# Patient Record
Sex: Female | Born: 1950
Health system: Southern US, Community
[De-identification: ages and names within clinical notes are randomized; demographics above are authoritative.]

## PROBLEM LIST (undated history)

## (undated) HISTORY — PX: TUBAL LIGATION: SHX77

## (undated) HISTORY — PX: FOOT SURGERY: SHX648

---

## 1999-07-01 ENCOUNTER — Other Ambulatory Visit: Admission: RE | Admit: 1999-07-01 | Discharge: 1999-07-01 | Payer: Self-pay | Admitting: Obstetrics and Gynecology

## 1999-08-25 ENCOUNTER — Other Ambulatory Visit: Admission: RE | Admit: 1999-08-25 | Discharge: 1999-08-25 | Payer: Self-pay | Admitting: Obstetrics and Gynecology

## 2013-09-25 ENCOUNTER — Encounter: Payer: Self-pay | Admitting: Obstetrics

## 2013-09-25 ENCOUNTER — Ambulatory Visit (INDEPENDENT_AMBULATORY_CARE_PROVIDER_SITE_OTHER): Admitting: Obstetrics

## 2013-09-25 VITALS — BP 143/88 | HR 70 | Temp 98.0°F | Wt 148.0 lb

## 2013-09-25 DIAGNOSIS — Z01419 Encounter for gynecological examination (general) (routine) without abnormal findings: Secondary | ICD-10-CM

## 2013-09-25 DIAGNOSIS — N760 Acute vaginitis: Secondary | ICD-10-CM

## 2013-09-25 DIAGNOSIS — Z1239 Encounter for other screening for malignant neoplasm of breast: Secondary | ICD-10-CM

## 2013-09-25 NOTE — Progress Notes (Signed)
Subjective:     Kathy James is a 62 y.o. female here for a routine exam.  Current complaints: annual exam.   Personal health questionnaire reviewed: yes.   Gynecologic History No LMP recorded. Patient is postmenopausal. Contraception: none Last Pap: 2013. Results were: normal Last mammogram: approximately 15-20 years ago . Results were: unsure  Obstetric History OB History  No data available     The following portions of the patient's history were reviewed and updated as appropriate: allergies, current medications, past family history, past medical history, past social history, past surgical history and problem list.  Review of Systems Pertinent items are noted in HPI.    Objective:    General appearance: alert and no distress Breasts: normal appearance, no masses or tenderness Abdomen: normal findings: soft, non-tender Pelvic: cervix normal in appearance, external genitalia normal, no adnexal masses or tenderness, no cervical motion tenderness, uterus normal size, shape, and consistency and vagina normal without discharge    Assessment:    Healthy female exam.    Plan:    Education reviewed: calcium supplements and self breast exams. Mammogram ordered. Follow up in: 1 year.

## 2013-09-25 NOTE — Addendum Note (Signed)
Addended by: George Hugh on: 09/25/2013 05:29 PM   Modules accepted: Orders

## 2013-09-26 LAB — PAP IG W/ RFLX HPV ASCU

## 2013-09-26 LAB — WET PREP BY MOLECULAR PROBE: Gardnerella vaginalis: POSITIVE — AB

## 2013-09-26 LAB — GC/CHLAMYDIA PROBE AMP: GC Probe RNA: NEGATIVE

## 2013-10-10 ENCOUNTER — Other Ambulatory Visit: Payer: Self-pay | Admitting: *Deleted

## 2013-10-10 DIAGNOSIS — B9689 Other specified bacterial agents as the cause of diseases classified elsewhere: Secondary | ICD-10-CM

## 2013-10-10 MED ORDER — METRONIDAZOLE 500 MG PO TABS: 500.0000 mg | ORAL_TABLET | Freq: Two times a day (BID) | ORAL | Status: DC

## 2013-10-11 ENCOUNTER — Other Ambulatory Visit: Payer: Self-pay | Admitting: *Deleted

## 2013-10-11 ENCOUNTER — Ambulatory Visit (HOSPITAL_COMMUNITY)
Admission: RE | Admit: 2013-10-11 | Discharge: 2013-10-11 | Disposition: A | Discharge status: Home / Self Care | Source: Ambulatory Visit | Attending: Obstetrics | Admitting: Obstetrics

## 2013-10-11 DIAGNOSIS — B9689 Other specified bacterial agents as the cause of diseases classified elsewhere: Secondary | ICD-10-CM

## 2013-10-11 DIAGNOSIS — Z1239 Encounter for other screening for malignant neoplasm of breast: Secondary | ICD-10-CM

## 2013-10-11 DIAGNOSIS — Z1231 Encounter for screening mammogram for malignant neoplasm of breast: Secondary | ICD-10-CM | POA: Insufficient documentation

## 2013-10-11 MED ORDER — METRONIDAZOLE 500 MG PO TABS: 500.0000 mg | ORAL_TABLET | Freq: Two times a day (BID) | ORAL | Status: AC

## 2018-08-22 ENCOUNTER — Ambulatory Visit: Admitting: Physician Assistant

## 2018-12-31 DIAGNOSIS — H52223 Regular astigmatism, bilateral: Secondary | ICD-10-CM | POA: Diagnosis not present

## 2019-02-07 ENCOUNTER — Ambulatory Visit: Payer: Self-pay | Admitting: Family Medicine

## 2019-02-14 ENCOUNTER — Ambulatory Visit (INDEPENDENT_AMBULATORY_CARE_PROVIDER_SITE_OTHER): Payer: Medicare HMO | Admitting: Family Medicine

## 2019-02-14 ENCOUNTER — Encounter: Payer: Self-pay | Admitting: Family Medicine

## 2019-02-14 ENCOUNTER — Other Ambulatory Visit: Payer: Self-pay

## 2019-02-14 VITALS — BP 139/86 | HR 73 | Temp 98.4°F | Resp 18 | Ht 61.81 in | Wt 166.0 lb

## 2019-02-14 DIAGNOSIS — Z1329 Encounter for screening for other suspected endocrine disorder: Secondary | ICD-10-CM | POA: Diagnosis not present

## 2019-02-14 DIAGNOSIS — Z1321 Encounter for screening for nutritional disorder: Secondary | ICD-10-CM

## 2019-02-14 DIAGNOSIS — Z130 Encounter for screening for diseases of the blood and blood-forming organs and certain disorders involving the immune mechanism: Secondary | ICD-10-CM

## 2019-02-14 DIAGNOSIS — Z13220 Encounter for screening for lipoid disorders: Secondary | ICD-10-CM

## 2019-02-14 DIAGNOSIS — Z0001 Encounter for general adult medical examination with abnormal findings: Secondary | ICD-10-CM | POA: Diagnosis not present

## 2019-02-14 DIAGNOSIS — Z1159 Encounter for screening for other viral diseases: Secondary | ICD-10-CM | POA: Diagnosis not present

## 2019-02-14 DIAGNOSIS — Z13228 Encounter for screening for other metabolic disorders: Secondary | ICD-10-CM | POA: Diagnosis not present

## 2019-02-14 DIAGNOSIS — Z780 Asymptomatic menopausal state: Secondary | ICD-10-CM

## 2019-02-14 DIAGNOSIS — Z1231 Encounter for screening mammogram for malignant neoplasm of breast: Secondary | ICD-10-CM

## 2019-02-14 DIAGNOSIS — Z0000 Encounter for general adult medical examination without abnormal findings: Secondary | ICD-10-CM

## 2019-02-14 DIAGNOSIS — Z1211 Encounter for screening for malignant neoplasm of colon: Secondary | ICD-10-CM

## 2019-02-14 DIAGNOSIS — Z23 Encounter for immunization: Secondary | ICD-10-CM

## 2019-02-14 NOTE — Progress Notes (Signed)
3/12/20205:00 PM  Kathy James [birth year], 68 y.o. female [id number]  Chief Complaint  Patient presents with  . Establish Care/CPE    HPI:   Patient is a 68 y.o. female  who presents today for CPE  Last CPE 2014 Cervical Cancer Screening: 2013, normal Breast Cancer Screening: 2014, normal Colorectal Cancer Screening: ordered today Bone Density Testing: ordered today Seasonal Influenza Vaccination: today Td/Tdap Vaccination: 2013 Pneumococcal Vaccination: today Zoster Vaccination: at pharmacy of choice Frequency of Dental evaluation: dentures Frequency of Eye evaluation: wears eyeglasses, lenscrafter  Fall Risk  02/14/2019  Falls in the past year? 0  Number falls in past yr: 0  Injury with Fall? 0  Follow up Falls evaluation completed     Depression screen [facility] 2/9 02/14/2019  Decreased Interest 0  Down, Depressed, Hopeless 0  PHQ - 2 Score 0    No Known Allergies  Prior to Admission medications   Not on File    History reviewed. No pertinent past medical history.  Past Surgical History:  Procedure Laterality Date  . FOOT SURGERY    . TUBAL LIGATION      Social History   Tobacco Use  . Smoking status: Former Smoker    Types: Cigarettes    Last attempt to quit: 1978    Years since quitting: 42.2  . Smokeless tobacco: Never Used  Substance Use Topics  . Alcohol use: No    History reviewed. No pertinent family history.   6CIT Screen 02/14/2019  What Year? 0 points  What month? 0 points  What time? 0 points  Count back from 20 0 points  Months in reverse 0 points  Repeat phrase 0 points  Total Score 0    Functional Status Survey: Is the patient deaf or have difficulty hearing?: No Does the patient have difficulty seeing, even when wearing glasses/contacts?: No Does the patient have difficulty concentrating, remembering, or making decisions?: No Does the patient have difficulty walking or climbing stairs?: No Does the patient have  difficulty dressing or bathing?: No Does the patient have difficulty doing errands alone such as visiting a doctor's office or shopping?: No  Advance directives discussed with patient She has not completed forms, forms given Patient wishes to be DNR  Review of Systems  Constitutional: Negative for chills and fever.  Respiratory: Negative for cough and shortness of breath.   Cardiovascular: Negative for chest pain, palpitations and leg swelling.  Gastrointestinal: Negative for abdominal pain, nausea and vomiting.  All other systems reviewed and are negative.    OBJECTIVE:  Blood pressure 139/86, pulse 73, temperature 98.4 F (36.9 C), temperature source Oral, resp. rate 18, height 5' 1.81" (1.57 m), weight 166 lb (75.3 kg), SpO2 95 %. Body mass index is 30.55 kg/m.    Physical Exam Vitals signs and nursing note reviewed.  Constitutional:      Appearance: She is well-developed.  HENT:     Head: Normocephalic and atraumatic.     Right Ear: Hearing, tympanic membrane, ear canal and external ear normal.     Left Ear: Hearing, tympanic membrane, ear canal and external ear normal.     Mouth/Throat:     Mouth: Mucous membranes are moist.     Pharynx: No oropharyngeal exudate or posterior oropharyngeal erythema.  Eyes:     Extraocular Movements: Extraocular movements intact.     Conjunctiva/sclera: Conjunctivae normal.     Pupils: Pupils are equal, round, and reactive to light.  Neck:     Musculoskeletal:  Neck supple.     Thyroid: No thyromegaly.  Cardiovascular:     Rate and Rhythm: Normal rate and regular rhythm.     Heart sounds: Normal heart sounds. No murmur. No friction rub. No gallop.   Pulmonary:     Effort: Pulmonary effort is normal.     Breath sounds: Normal breath sounds. No wheezing, rhonchi or rales.  Abdominal:     General: Bowel sounds are normal. There is no distension.     Palpations: Abdomen is soft. There is no hepatomegaly, splenomegaly or mass.      Tenderness: There is no abdominal tenderness.  Musculoskeletal: Normal range of motion.     Right lower leg: No edema.     Left lower leg: No edema.  Lymphadenopathy:     Cervical: No cervical adenopathy.  Skin:    General: Skin is warm and dry.  Neurological:     Mental Status: She is alert and oriented to person, place, and time.     Cranial Nerves: No cranial nerve deficit.     Gait: Gait normal.     Deep Tendon Reflexes: Reflexes are normal and symmetric.  Psychiatric:        Mood and Affect: Mood normal.        Behavior: Behavior normal.     ASSESSMENT and PLAN  1. Annual physical exam No concerns per history or exam. Routine HCM labs ordered. HCM reviewed/discussed. Anticipatory guidance regarding healthy weight, lifestyle and choices given.   2. Need for prophylactic vaccination and inoculation against influenza - Flu vaccine HIGH DOSE PF  3. Need for pneumococcal vaccination - Pneumococcal conjugate vaccine 13-valent  4. Visit for screening mammogram - MM Digital Diagnostic Bilat; Future  5. Special screening for malignant neoplasms, colon - Ambulatory referral to Gastroenterology  6. Need for hepatitis C screening test - Hepatitis C Antibody  7. Postmenopausal estrogen deficiency - DG Bone Density; Future  8. Screening for endocrine, nutritional, metabolic and immunity disorder - CMP14+EGFR - TSH  9. Screening for lipid disorders - Lipid panel  Return in about 1 year (around 02/14/2020).    Rutherford Guys, MD [facility] [place], [place] [address] Ph.  [phone] Fax [phone]

## 2019-02-14 NOTE — Patient Instructions (Addendum)
Preventive Care 67 Years and Older, Female Preventive care refers to lifestyle choices and visits with your health care provider that can promote health and wellness. What does preventive care include?  A yearly physical exam. This is also called an annual well check.  Dental exams once or twice a year.  Routine eye exams. Ask your health care provider how often you should have your eyes checked.  Personal lifestyle choices, including: ? Daily care of your teeth and gums. ? Regular physical activity. ? Eating a healthy diet. ? Avoiding tobacco and drug use. ? Limiting alcohol use. ? Practicing safe sex. ? Taking low-dose aspirin every day. ? Taking vitamin and mineral supplements as recommended by your health care provider. What happens during an annual well check? The services and screenings done by your health care provider during your annual well check will depend on your age, overall health, lifestyle risk factors, and family history of disease. Counseling Your health care provider may ask you questions about your:  Alcohol use.  Tobacco use.  Drug use.  Emotional well-being.  Home and relationship well-being.  Sexual activity.  Eating habits.  History of falls.  Memory and ability to understand (cognition).  Work and work Statistician.  Reproductive health.  Screening You may have the following tests or measurements:  Height, weight, and BMI.  Blood pressure.  Lipid and cholesterol levels. These may be checked every 5 years, or more frequently if you are over 30 years old.  Skin check.  Lung cancer screening. You may have this screening every year starting at age 27 if you have a 30-pack-year history of smoking and currently smoke or have quit within the past 15 years.  Colorectal cancer screening. All adults should have this screening starting at age 33 and continuing until age 46. You will have tests every 1-10 years, depending on your results and the  type of screening test. People at increased risk should start screening at an earlier age. Screening tests may include: ? Guaiac-based fecal occult blood testing. ? Fecal immunochemical test (FIT). ? Stool DNA test. ? Virtual colonoscopy. ? Sigmoidoscopy. During this test, a flexible tube with a tiny camera (sigmoidoscope) is used to examine your rectum and lower colon. The sigmoidoscope is inserted through your anus into your rectum and lower colon. ? Colonoscopy. During this test, a long, thin, flexible tube with a tiny camera (colonoscope) is used to examine your entire colon and rectum.  Hepatitis C blood test.  Hepatitis B blood test.  Sexually transmitted disease (STD) testing.  Diabetes screening. This is done by checking your blood sugar (glucose) after you have not eaten for a while (fasting). You may have this done every 1-3 years.  Bone density scan. This is done to screen for osteoporosis. You may have this done starting at age 37.  Mammogram. This may be done every 1-2 years. Talk to your health care provider about how often you should have regular mammograms. Talk with your health care provider about your test results, treatment options, and if necessary, the need for more tests. Vaccines Your health care provider may recommend certain vaccines, such as:  Influenza vaccine. This is recommended every year.  Tetanus, diphtheria, and acellular pertussis (Tdap, Td) vaccine. You may need a Td booster every 10 years.  Varicella vaccine. You may need this if you have not been vaccinated.  Zoster vaccine. You may need this after age 38.  Measles, mumps, and rubella (MMR) vaccine. You may need at least  one dose of MMR if you were born in 1957 or later. You may also need a second dose.  Pneumococcal 13-valent conjugate (PCV13) vaccine. One dose is recommended after age 43.  Pneumococcal polysaccharide (PPSV23) vaccine. One dose is recommended after age 4.  Meningococcal  vaccine. You may need this if you have certain conditions.  Hepatitis A vaccine. You may need this if you have certain conditions or if you travel or work in places where you may be exposed to hepatitis A.  Hepatitis B vaccine. You may need this if you have certain conditions or if you travel or work in places where you may be exposed to hepatitis B.  Haemophilus influenzae type b (Hib) vaccine. You may need this if you have certain conditions. Talk to your health care provider about which screenings and vaccines you need and how often you need them. This information is not intended to replace advice given to you by your health care provider. Make sure you discuss any questions you have with your health care provider. Document Released: 12/18/2015 Document Revised: 01/11/2018 Document Reviewed: 09/22/2015 Elsevier Interactive Patient Education  [employer].     If you have lab work done today you will be contacted with your lab results within the next 2 weeks.  If you have not heard from [place] then please contact us. The fastest way to get your results is to register for My Chart.   IF you received an x-ray today, you will receive an invoice from [facility]iology. Please contact [facility] at [phone] with questions or concerns regarding your invoice.   IF you received labwork today, you will receive an invoice from [facility]. Please contact [facility] at [phone] with questions or concerns regarding your invoice.   Our billing staff will not be able to assist you with questions regarding bills from these companies.  You will be contacted with the lab results as soon as they are available. The fastest way to get your results is to activate your My Chart account. Instructions are located on the last page of this paperwork. If you have not heard from [place] regarding the results in 2 weeks, please contact this office.    We recommend that you schedule a mammogram for  breast cancer screening. Typically, you do not need a referral to do this. Please contact a local imaging center to schedule your mammogram.  [facility] - [phone]  *ask for the Radiology Department [facility] - [phone] or [phone]  [facility] - [phone] [place] [phone] [facility] - [phone]  *ask for the [facility] - [phone]  *ask for the Radiology Department [facility] - [phone]  *ask for the [facility] - [phone]

## 2019-02-15 LAB — LIPID PANEL
Chol/HDL Ratio: 3.6 ratio (ref 0.0–4.4)
Cholesterol, Total: 246 mg/dL — ABNORMAL HIGH (ref 100–199)
HDL: 69 mg/dL (ref >39)
LDL Calculated: 145 mg/dL — ABNORMAL HIGH (ref 0–99)
Triglycerides: 162 mg/dL — ABNORMAL HIGH (ref 0–149)
VLDL Cholesterol Cal: 32 mg/dL (ref 5–40)

## 2019-02-15 LAB — CMP14+EGFR
ALT: 19 IU/L (ref 0–32)
AST: 17 IU/L (ref 0–40)
Albumin/Globulin Ratio: 1.6 (ref 1.2–2.2)
Albumin: 4.6 g/dL (ref 3.8–4.8)
Alkaline Phosphatase: 95 IU/L (ref 39–117)
BUN/Creatinine Ratio: 28 (ref 12–28)
BUN: 18 mg/dL (ref 8–27)
Bilirubin Total: <0.2 mg/dL (ref 0.0–1.2)
CO2: 22 mmol/L (ref 20–29)
Calcium: 9.4 mg/dL (ref 8.7–10.3)
Chloride: 104 mmol/L (ref 96–106)
Creatinine, Ser: 0.64 mg/dL (ref 0.57–1.00)
GFR calc Af Amer: 108 mL/min/{1.73_m2} (ref >59)
GFR calc non Af Amer: 93 mL/min/{1.73_m2} (ref >59)
Globulin, Total: 2.8 g/dL (ref 1.5–4.5)
Glucose: 111 mg/dL — ABNORMAL HIGH (ref 65–99)
Potassium: 4.5 mmol/L (ref 3.5–5.2)
Sodium: 143 mmol/L (ref 134–144)
Total Protein: 7.4 g/dL (ref 6.0–8.5)

## 2019-02-15 LAB — HEPATITIS C ANTIBODY: Hep C Virus Ab: 0.1 s/co ratio (ref 0.0–0.9)

## 2019-02-15 LAB — TSH: TSH: 1.64 u[IU]/mL (ref 0.450–4.500)

## 2019-02-27 ENCOUNTER — Encounter: Payer: Self-pay | Admitting: Radiology

## 2019-05-24 ENCOUNTER — Ambulatory Visit
Admission: RE | Admit: 2019-05-24 | Discharge: 2019-05-24 | Disposition: A | Discharge status: Home / Self Care | Source: Ambulatory Visit | Attending: Family Medicine | Admitting: Family Medicine

## 2019-05-24 ENCOUNTER — Other Ambulatory Visit: Payer: Self-pay

## 2019-05-24 DIAGNOSIS — M85851 Other specified disorders of bone density and structure, right thigh: Secondary | ICD-10-CM | POA: Diagnosis not present

## 2019-05-24 DIAGNOSIS — Z780 Asymptomatic menopausal state: Secondary | ICD-10-CM

## 2019-05-24 DIAGNOSIS — Z1231 Encounter for screening mammogram for malignant neoplasm of breast: Secondary | ICD-10-CM | POA: Diagnosis not present

## 2019-06-28 ENCOUNTER — Encounter: Payer: Self-pay | Admitting: Gastroenterology

## 2019-06-28 ENCOUNTER — Ambulatory Visit (INDEPENDENT_AMBULATORY_CARE_PROVIDER_SITE_OTHER): Payer: Medicare HMO | Admitting: Gastroenterology

## 2019-06-28 VITALS — Ht 61.0 in | Wt 170.0 lb

## 2019-06-28 DIAGNOSIS — Z1211 Encounter for screening for malignant neoplasm of colon: Secondary | ICD-10-CM

## 2019-06-28 MED ORDER — NA SULFATE-K SULFATE-MG SULF 17.5-3.13-1.6 GM/177ML PO SOLN: 1.0000 | ORAL | 0 refills | Status: DC

## 2019-06-28 NOTE — Progress Notes (Signed)
TELEHEALTH VISIT  Referring Provider: Rutherford Guys, MD Primary Care Physician:  Rutherford Guys, MD   Tele-visit due to COVID-19 pandemic Patient requested visit virtually, consented to the virtual encounter via video enabled telemedicine application (Doximity) Contact made at: 13:00 06/28/19 Patient verified by name and date of birth Location of patient: Home Location provider: [facility] Names of persons participating: Me, patient, Tinnie Gens CMA Time spent on telehealth visit: 16 minutes I discussed the limitations of evaluation and management by telemedicine. The patient expressed understanding and agreed to proceed.  Reason for Consultation:  Colonoscopy   IMPRESSION:  Need for colon cancer screening No ongoing GI symptoms No known family history of colon cancer or polyps  PLAN: Colonoscopy  I consented the patient discussing the risks, benefits, and alternatives to endoscopic evaluation. In particular, we discussed the risks that include, but are not limited to, reaction to medication, cardiopulmonary compromise, bleeding requiring blood transfusion, aspiration resulting in pneumonia, perforation requiring surgery, lack of diagnosis, severe illness requiring hospitalization, and even death. We reviewed the risk of missed lesion including polyps or even cancer. The patient acknowledges these risks and asks that we proceed.  HPI: Kathy James is a 68 y.o. female referred by Dr. Pamella Pert for colon cancer screening.  The history is obtained through the patient and review of her electronic health record.  She is originally from [place]. Has lived in the [place] for almost 27 years.  Stays at home with her 5 grandchildren.   Colonoscopy over 10 years ago. She originally thought it was 6-8 years ago, but, she remembered that her son who drove her took her when he first got his drivers license over 10 years ago.  She remembers the exam being normal at that time.    There is no dysphagia, odynophagia, regurgitation,  heartburn, nausea, abdominal pain, change in bowel habits, melena, hematochezia, or bright red blood per rectum. There is no anorexia or recent change in weight.   No known family history of colon cancer or polyps. No family history of uterine/endometrial cancer, pancreatic cancer or gastric/stomach cancer.  History reviewed. No pertinent past medical history.  Past Surgical History:  Procedure Laterality Date  . FOOT SURGERY    . TUBAL LIGATION      Current Outpatient Medications  Medication Sig Dispense Refill  . Multiple Vitamin (MULTIVITAMIN) tablet Take 1 tablet by mouth daily.     No current facility-administered medications for this visit.     Allergies as of 06/28/2019  . (No Known Allergies)    History reviewed. No pertinent family history.  Social History   Socioeconomic History  . Marital status: Married    Spouse name: Not on file  . Number of children: 2  . Years of education: Not on file  . Highest education level: Not on file  Occupational History  . Not on file  Social Needs  . Financial resource strain: Not on file  . Food insecurity    Worry: Not on file    Inability: Not on file  . Transportation needs    Medical: Not on file    Non-medical: Not on file  Tobacco Use  . Smoking status: Former Smoker    Types: Cigarettes    Quit date: 1978    Years since quitting: 42.5  . Smokeless tobacco: Never Used  Substance and Sexual Activity  . Alcohol use: No  . Drug use: No  . Sexual activity: Yes  Lifestyle  .  Physical activity    Days per week: Not on file    Minutes per session: Not on file  . Stress: Not on file  Relationships  . Social Herbalist on phone: Not on file    Gets together: Not on file    Attends religious service: Not on file    Active member of club or organization: Not on file    Attends meetings of clubs or organizations: Not on file    Relationship status:  Not on file  . Intimate partner violence    Fear of current or ex partner: Not on file    Emotionally abused: Not on file    Physically abused: Not on file    Forced sexual activity: Not on file  Other Topics Concern  . Not on file  Social History Narrative  . Not on file    Review of Systems: ALL ROS discussed and all others negative except listed in HPI.  Physical Exam: Complete physical exam not performed due to the limits inherent in a telehealth encounter.  General: Awake, alert, and oriented, and well communicative. In no acute distress.  HEENT: EOMI, non-icteric sclera, NCAT, MMM  Neck: Normal movement of head and neck  Pulm: No labored breathing, speaking in full sentences without conversational dyspnea  Derm: No apparent lesions or bruising in visible field  MS: Moves all visible extremities without noticeable abnormality  Psych: Pleasant, cooperative, normal speech, normal affect and normal insight Neuro: Alert and appropriate   Zehra Rucci L. Tarri Glenn, MD, MPH [facility] 06/28/2019, 1:14 PM

## 2019-06-28 NOTE — Patient Instructions (Signed)
Tips for colonoscopy:  - Stay well hydrated for 3-4 days prior to the exam. This reduces nausea and dehydration.  - To prevent skin/hemorrhoid irritation - prior to wiping, put A&Dointment or vaseline on the toilet paper. - Keep a towel or pad on the bed.  - Drink  64oz of clear liquids in the morning of prep day (prior to starting the prep) to be sure that there is enough fluid to flush the colon and stay hydrated!!!! This is in addition to the fluids required for preparation. - Use of a flavored hard candy, such as grape Anise Salvo, can counteract some of the flavor of the prep and may prevent some nausea.

## 2019-07-16 ENCOUNTER — Telehealth: Payer: Self-pay | Admitting: Gastroenterology

## 2019-07-16 NOTE — Telephone Encounter (Signed)
Left message on vmail regarding Covid-19 screening questions. °Covid-19 Screening Questions: °  °Do you now or have you had a fever in the last 14 days?  °  °Do you have any respiratory symptoms of shortness of breath or cough now or in the last 14 days?  °  °Do you have any family members or close contacts with diagnosed  °or suspected Covid-19 in the past 14 days?  °  °Have you been tested for Covid-19 and found to be positive?  °  ° °

## 2019-07-17 ENCOUNTER — Encounter: Payer: Self-pay | Admitting: Gastroenterology

## 2019-07-17 ENCOUNTER — Ambulatory Visit (AMBULATORY_SURGERY_CENTER): Payer: Medicare HMO | Admitting: Gastroenterology

## 2019-07-17 ENCOUNTER — Other Ambulatory Visit: Payer: Self-pay

## 2019-07-17 VITALS — BP 113/63 | HR 68 | Temp 98.6°F | Resp 18 | Ht 61.0 in | Wt 170.0 lb

## 2019-07-17 DIAGNOSIS — Z1211 Encounter for screening for malignant neoplasm of colon: Secondary | ICD-10-CM

## 2019-07-17 DIAGNOSIS — D122 Benign neoplasm of ascending colon: Secondary | ICD-10-CM | POA: Diagnosis not present

## 2019-07-17 DIAGNOSIS — D120 Benign neoplasm of cecum: Secondary | ICD-10-CM | POA: Diagnosis not present

## 2019-07-17 MED ORDER — SODIUM CHLORIDE 0.9 % IV SOLN: 500.0000 mL | Freq: Once | INTRAVENOUS | Status: DC

## 2019-07-17 NOTE — Patient Instructions (Signed)
Please read handouts provided. Await pathology results. Continue present medications. High Fiber diet.        YOU HAD AN ENDOSCOPIC PROCEDURE TODAY AT [facility]:   Refer to the procedure report that was given to you for any specific questions about what was found during the examination.  If the procedure report does not answer your questions, please call your gastroenterologist to clarify.  If you requested that your care partner not be given the details of your procedure findings, then the procedure report has been included in a sealed envelope for you to review at your convenience later.  YOU SHOULD EXPECT: Some feelings of bloating in the abdomen. Passage of more gas than usual.  Walking can help get rid of the air that was put into your GI tract during the procedure and reduce the bloating. If you had a lower endoscopy (such as a colonoscopy or flexible sigmoidoscopy) you may notice spotting of blood in your stool or on the toilet paper. If you underwent a bowel prep for your procedure, you may not have a normal bowel movement for a few days.  Please Note:  You might notice some irritation and congestion in your nose or some drainage.  This is from the oxygen used during your procedure.  There is no need for concern and it should clear up in a day or so.  SYMPTOMS TO REPORT IMMEDIATELY:   Following lower endoscopy (colonoscopy or flexible sigmoidoscopy):  Excessive amounts of blood in the stool  Significant tenderness or worsening of abdominal pains  Swelling of the abdomen that is new, acute  Fever of 100F or higher   For urgent or emergent issues, a gastroenterologist can be reached at any hour by calling [phone].   DIET:  We do recommend a small meal at first, but then you may proceed to your regular diet.  Drink plenty of fluids but you should avoid alcoholic beverages for 24 hours.  ACTIVITY:  You should plan to take it easy for the rest of today  and you should NOT DRIVE or use heavy machinery until tomorrow (because of the sedation medicines used during the test).    FOLLOW UP: Our staff will call the number listed on your records 48-72 hours following your procedure to check on you and address any questions or concerns that you may have regarding the information given to you following your procedure. If we do not reach you, we will leave a message.  We will attempt to reach you two times.  During this call, we will ask if you have developed any symptoms of COVID 19. If you develop any symptoms (ie: fever, flu-like symptoms, shortness of breath, cough etc.) before then, please call [phone].  If you test positive for Covid 19 in the 2 weeks post procedure, please call and report this information to [place].    If any biopsies were taken you will be contacted by phone or by letter within the next 1-3 weeks.  Please call us at [phone] if you have not heard about the biopsies in 3 weeks.    SIGNATURES/CONFIDENTIALITY: You and/or your care partner have signed paperwork which will be entered into your electronic medical record.  These signatures attest to the fact that that the information above on your After Visit Summary has been reviewed and is understood.  Full responsibility of the confidentiality of this discharge information lies with you and/or your care-partner.

## 2019-07-17 NOTE — Progress Notes (Signed)
Vitals by CW 

## 2019-07-17 NOTE — Progress Notes (Signed)
Called to room to assist during endoscopic procedure.  Patient ID and intended procedure confirmed with present staff. Received instructions for my participation in the procedure from the performing physician.  

## 2019-07-17 NOTE — Progress Notes (Signed)
PT taken to PACU. Monitors in place. VSS. Report given to RN. 

## 2019-07-17 NOTE — Telephone Encounter (Signed)
Pt returned call and answered “No” to all questions.  °  °Pt made aware of that care partner may come to the lobby during the procedure but will need to provide their own mask. ° ° °

## 2019-07-17 NOTE — Op Note (Signed)
[facility] Patient Name: Kathy James Procedure Date: 07/17/2019 1:09 PM MRN: [id number] Endoscopist: Thornton Park MD, MD Age: 68 Referring MD:  Date of Birth: Jun 14, 1951 Gender: Female Account #: [id number] Procedure:                Colonoscopy Indications:              Screening for colorectal malignant neoplasm                           No ongoing GI symptoms                           No known family history of colon cancer or polyps                           Last colonoscopy 10 years ago Medicines:                See the Anesthesia note for documentation of the                            administered medications Procedure:                Pre-Anesthesia Assessment:                           - Prior to the procedure, a History and Physical                            was performed, and patient medications and                            allergies were reviewed. The patient's tolerance of                            previous anesthesia was also reviewed. The risks                            and benefits of the procedure and the sedation                            options and risks were discussed with the patient.                            All questions were answered, and informed consent                            was obtained. Prior Anticoagulants: The patient has                            taken no previous anticoagulant or antiplatelet                            agents. ASA Grade Assessment: II - A patient with  mild systemic disease. After reviewing the risks                            and benefits, the patient was deemed in                            satisfactory condition to undergo the procedure.                           After obtaining informed consent, the colonoscope                            was passed under direct vision. Throughout the                            procedure, the patient's blood pressure, pulse, and                 oxygen saturations were monitored continuously. The                            Colonoscope was introduced through the anus and                            advanced to the the terminal ileum, with                            identification of the appendiceal orifice and IC                            valve. A second forward view of the right colon was                            performed. The colonoscopy was performed without                            difficulty. The patient tolerated the procedure                            well. The quality of the bowel preparation was                            good. The terminal ileum, ileocecal valve,                            appendiceal orifice, and rectum were photographed. Scope In: 1:33:02 PM Scope Out: 1:50:53 PM Scope Withdrawal Time: 0 hours 15 minutes 19 seconds  Total Procedure Duration: 0 hours 17 minutes 51 seconds  Findings:                 Multiple small and large-mouthed diverticula were                            found in the entire colon.  A 2 mm polyp was found in the cecum. The polyp was                            sessile. The polyp was removed with a cold snare.                            Resection and retrieval were complete. Estimated                            blood loss was minimal.                           A less than 1 mm polyp was found in the cecum. The                            polyp was sessile. I was unable to remove it with a                            snare because it was so small. The polyp was                            removed with a cold biopsy forceps. Resection and                            retrieval were complete.                           A 2 mm polyp was found in the ascending colon. The                            polyp was sessile. The polyp was removed with a                            cold snare. Resection and retrieval were complete.                             Estimated blood loss was minimal.                           Two sessile polyps were found in the distal                            ascending colon. These polyps were removed with a                            cold biopsy forceps. Resection and retrieval were                            complete. Estimated blood loss was minimal.                           The exam was otherwise without abnormality on  direct and retroflexion views. Complications:            No immediate complications. Estimated blood loss:                            Minimal. Estimated Blood Loss:     Estimated blood loss was minimal. Impression:               - Diverticulosis in the entire examined colon.                           - One 2 mm polyp in the cecum, removed with a cold                            snare. Resected and retrieved.                           - One less than 1 mm polyp in the cecum, removed                            with a cold biopsy forceps. Resected and retrieved.                           - One 2 mm polyp in the ascending colon, removed                            with a cold snare. Resected and retrieved.                           - Two polyps in the distal ascending colon, removed                            with a cold biopsy forceps. Resected and retrieved.                           - The examination was otherwise normal on direct                            and retroflexion views. Recommendation:           - Patient has a contact number available for                            emergencies. The signs and symptoms of potential                            delayed complications were discussed with the                            patient. Return to normal activities tomorrow.                            Written discharge instructions were provided to the  patient.                           - High fiber diet.                           - Continue present  medications.                           - Await pathology results.                           - Repeat colonoscopy in 3 years for surveillance if                            at least 3 polyps are adenomas. If only 1 or 2                            polyps are adenomas, repeat colonoscopy in 7 years. Thornton Park MD, MD 07/17/2019 2:00:41 PM This report has been signed electronically.

## 2019-07-19 ENCOUNTER — Telehealth: Payer: Self-pay | Admitting: *Deleted

## 2019-07-19 NOTE — Telephone Encounter (Signed)
First follow up call attempt.  Reached voicemail with phone number identified.  Message left to call if any questions or concerns regarding procedure or COVID19 

## 2019-07-19 NOTE — Telephone Encounter (Signed)
  Follow up Call-  Call back number 07/17/2019  Post procedure Call Back phone  # [phone]  Permission to leave phone message Yes  Some recent data might be hidden     Patient questions:  Do you have a fever, pain , or abdominal swelling? No. Pain Score  0 *  Have you tolerated food without any problems? Yes.    Have you been able to return to your normal activities? Yes.    Do you have any questions about your discharge instructions: Diet   No. Medications  No. Follow up visit  No.  Do you have questions or concerns about your Care? No.  Actions: * If pain score is 4 or above: No action needed, pain <4.  1. Have you developed a fever since your procedure? no  2.   Have you had an respiratory symptoms (SOB or cough) since your procedure? no  3.   Have you tested positive for COVID 19 since your procedure no  4.   Have you had any family members/close contacts diagnosed with the COVID 19 since your procedure?  no   If yes to any of these questions please route to Joylene John, RN and Alphonsa Gin, Therapist, sports.

## 2019-07-24 ENCOUNTER — Encounter: Payer: Self-pay | Admitting: Gastroenterology

## 2020-02-17 ENCOUNTER — Ambulatory Visit (INDEPENDENT_AMBULATORY_CARE_PROVIDER_SITE_OTHER): Payer: Medicare HMO | Admitting: Family Medicine

## 2020-02-17 VITALS — BP 113/63 | Ht 61.0 in | Wt 168.0 lb

## 2020-02-17 DIAGNOSIS — Z0000 Encounter for general adult medical examination without abnormal findings: Secondary | ICD-10-CM

## 2020-02-17 NOTE — Progress Notes (Signed)
Presents today for [employer] Visit   Date of last exam:  02/14/2019  Interpreter used for this visit?  I connected with  Kathy James on 02/17/20 by a telephone and verified that I am speaking with the correct person using two identifiers.   I discussed the limitations of evaluation and management by telemedicine. The patient expressed understanding and agreed to proceed.   Patient Care Team: Rutherford Guys, MD as PCP - General (Family Medicine)   Other items to address today:  Discussed immunizations Discussed Eye/Dental    Other Screening:  Last lipid screening: 02/14/2019  ADVANCE DIRECTIVES: Discussed: yes On File: no Materials Provided: yes  Immunization status:  Immunization History  Administered Date(s) Administered  . Influenza, High Dose Seasonal PF 02/14/2019  . Pneumococcal Conjugate-13 02/14/2019  . Tdap 02/17/2012     Health Maintenance Due  Topic Date Due  . PNA vac Low Risk Adult (2 of 2 - PPSV23) 02/14/2020     Functional Status Survey: Is the patient deaf or have difficulty hearing?: No Does the patient have difficulty seeing, even when wearing glasses/contacts?: No Does the patient have difficulty concentrating, remembering, or making decisions?: Yes(normal forgets where keyes) Does the patient have difficulty walking or climbing stairs?: No Does the patient have difficulty dressing or bathing?: No Does the patient have difficulty doing errands alone such as visiting a doctor's office or shopping?: No   6CIT Screen 02/17/2020 02/14/2019  What Year? 0 points 0 points  What month? 0 points 0 points  What time? (No Data) 0 points  Count back from 20 0 points 0 points  Months in reverse 0 points 0 points  Repeat phrase 4 points 0 points  Total Score - 0        Clinical Support from 02/17/2020 in [facility]  AUDIT-C Score  0       Home Environment:   Lives in two home Takes care of 4 grand kids   Ages [id number] they live with patient No clutter No scattered rugs yes grab bars Adequate lighting/no clutter      Past Surgical History:  Procedure Laterality Date  . FOOT SURGERY    . TUBAL LIGATION       History reviewed. No pertinent family history.   Social History   Socioeconomic History  . Marital status: Married    Spouse name: Not on file  . Number of children: 2  . Years of education: Not on file  . Highest education level: Not on file  Occupational History  . Not on file  Tobacco Use  . Smoking status: Former Smoker    Types: Cigarettes    Quit date: 1978    Years since quitting: 43.2  . Smokeless tobacco: Never Used  Substance and Sexual Activity  . Alcohol use: No  . Drug use: No  . Sexual activity: Yes  Other Topics Concern  . Not on file  Social History Narrative  . Not on file   Social Determinants of Health   Financial Resource Strain:   . Difficulty of Paying Living Expenses:   Food Insecurity:   . Worried About Charity fundraiser in the Last Year:   . Arboriculturist in the Last Year:   Transportation Needs:   . Film/video editor (Medical):   Marland Kitchen Lack of Transportation (Non-Medical):   Physical Activity:   . Days of Exercise per Week:   . Minutes of  Exercise per Session:   Stress:   . Feeling of Stress :   Social Connections:   . Frequency of Communication with Friends and Family:   . Frequency of Social Gatherings with Friends and Family:   . Attends Religious Services:   . Active Member of Clubs or Organizations:   . Attends Archivist Meetings:   Marland Kitchen Marital Status:   Intimate Partner Violence:   . Fear of Current or Ex-Partner:   . Emotionally Abused:   Marland Kitchen Physically Abused:   . Sexually Abused:      No Known Allergies   Prior to Admission medications   Medication Sig Start Date End Date Taking? Authorizing Provider  Ibuprofen (ADVIL) 200 MG CAPS Take by mouth as needed.   Yes [provider]    Multiple Vitamin (MULTIVITAMIN) tablet Take 1 tablet by mouth daily.   Yes [provider]     Depression screen [facility] 2/9 02/17/2020 02/14/2019  Decreased Interest 0 0  Down, Depressed, Hopeless 0 0  PHQ - 2 Score 0 0     Fall Risk  02/17/2020 02/14/2019  Falls in the past year? 1 0  Number falls in past yr: 0 0  Comment dog tripped her   no injury -  Injury with Fall? 0 0  Follow up Falls evaluation completed;Education provided Falls evaluation completed      PHYSICAL EXAM: BP 113/63 Comment: not in clinic  Ht 5\' 1"  (1.549 m)   Wt 168 lb (76.2 kg) Comment: per patient  BMI 31.74 kg/m    Wt Readings from Last 3 Encounters:  02/17/20 168 lb (76.2 kg)  07/17/19 170 lb (77.1 kg)  06/28/19 170 lb (77.1 kg)     No exam data present    Physical Exam   Education/Counseling provided regarding diet and exercise, prevention of chronic diseases, smoking/tobacco cessation, if applicable, and reviewed "Covered Medicare Preventive Services."

## 2020-02-17 NOTE — Patient Instructions (Addendum)
Thank you for taking time to come for your Medicare Wellness Visit. I appreciate your ongoing commitment to your health goals. Please review the following plan we discussed and let me know if I can assist you in the future.  Leroy Kennedy LPN  reventive Care 64 Years and Older, Female Preventive care refers to lifestyle choices and visits with your health care provider that can promote health and wellness. This includes:  A yearly physical exam. This is also called an annual well check.  Regular dental and eye exams.  Immunizations.  Screening for certain conditions.  Healthy lifestyle choices, such as diet and exercise. What can I expect for my preventive care visit? Physical exam Your health care provider will check:  Height and weight. These may be used to calculate body mass index (BMI), which is a measurement that tells if you are at a healthy weight.  Heart rate and blood pressure.  Your skin for abnormal spots. Counseling Your health care provider may ask you questions about:  Alcohol, tobacco, and drug use.  Emotional well-being.  Home and relationship well-being.  Sexual activity.  Eating habits.  History of falls.  Memory and ability to understand (cognition).  Work and work Statistician.  Pregnancy and menstrual history. What immunizations do I need?  Influenza (flu) vaccine  This is recommended every year. Tetanus, diphtheria, and pertussis (Tdap) vaccine  You may need a Td booster every 10 years. Varicella (chickenpox) vaccine  You may need this vaccine if you have not already been vaccinated. Zoster (shingles) vaccine  You may need this after age 88. Pneumococcal conjugate (PCV13) vaccine  One dose is recommended after age 53. Pneumococcal polysaccharide (PPSV23) vaccine  One dose is recommended after age 75. Measles, mumps, and rubella (MMR) vaccine  You may need at least one dose of MMR if you were born in 1957 or later. You may also  need a second dose. Meningococcal conjugate (MenACWY) vaccine  You may need this if you have certain conditions. Hepatitis A vaccine  You may need this if you have certain conditions or if you travel or work in places where you may be exposed to hepatitis A. Hepatitis B vaccine  You may need this if you have certain conditions or if you travel or work in places where you may be exposed to hepatitis B. Haemophilus influenzae type b (Hib) vaccine  You may need this if you have certain conditions. You may receive vaccines as individual doses or as more than one vaccine together in one shot (combination vaccines). Talk with your health care provider about the risks and benefits of combination vaccines. What tests do I need? Blood tests  Lipid and cholesterol levels. These may be checked every 5 years, or more frequently depending on your overall health.  Hepatitis C test.  Hepatitis B test. Screening  Lung cancer screening. You may have this screening every year starting at age 56 if you have a 30-pack-year history of smoking and currently smoke or have quit within the past 15 years.  Colorectal cancer screening. All adults should have this screening starting at age 78 and continuing until age 39. Your health care provider may recommend screening at age 45 if you are at increased risk. You will have tests every 1-10 years, depending on your results and the type of screening test.  Diabetes screening. This is done by checking your blood sugar (glucose) after you have not eaten for a while (fasting). You may have this done every 1-3  years.  Mammogram. This may be done every 1-2 years. Talk with your health care provider about how often you should have regular mammograms.  BRCA-related cancer screening. This may be done if you have a family history of breast, ovarian, tubal, or peritoneal cancers. Other tests  Sexually transmitted disease (STD) testing.  Bone density scan. This is done  to screen for osteoporosis. You may have this done starting at age 94. Follow these instructions at home: Eating and drinking  Eat a diet that includes fresh fruits and vegetables, whole grains, lean protein, and low-fat dairy products. Limit your intake of foods with high amounts of sugar, saturated fats, and salt.  Take vitamin and mineral supplements as recommended by your health care provider.  Do not drink alcohol if your health care provider tells you not to drink.  If you drink alcohol: ? Limit how much you have to 0-1 drink a day. ? Be aware of how much alcohol is in your drink. In the [place] one drink equals one 12 oz bottle of beer (355 mL), one 5 oz glass of wine (148 mL), or one 1 oz glass of hard liquor (44 mL). Lifestyle  Take daily care of your teeth and gums.  Stay active. Exercise for at least 30 minutes on 5 or more days each week.  Do not use any products that contain nicotine or tobacco, such as cigarettes, e-cigarettes, and chewing tobacco. If you need help quitting, ask your health care provider.  If you are sexually active, practice safe sex. Use a condom or other form of protection in order to prevent STIs (sexually transmitted infections).  Talk with your health care provider about taking a low-dose aspirin or statin. What's next?  Go to your health care provider once a year for a well check visit.  Ask your health care provider how often you should have your eyes and teeth checked.  Stay up to date on all vaccines. This information is not intended to replace advice given to you by your health care provider. Make sure you discuss any questions you have with your health care provider. Document Revised: 11/15/2018 Document Reviewed: 11/15/2018 Elsevier Patient Education  2020 [employer].

## 2020-07-06 IMAGING — MG DIGITAL SCREENING BILATERAL MAMMOGRAM WITH CAD
4 series · 4 of 4 positions shown · non-contrast
Comparison: Previous exam(s).

CLINICAL DATA: Screening.

EXAM:
DIGITAL SCREENING BILATERAL MAMMOGRAM WITH CAD

[L MLO]
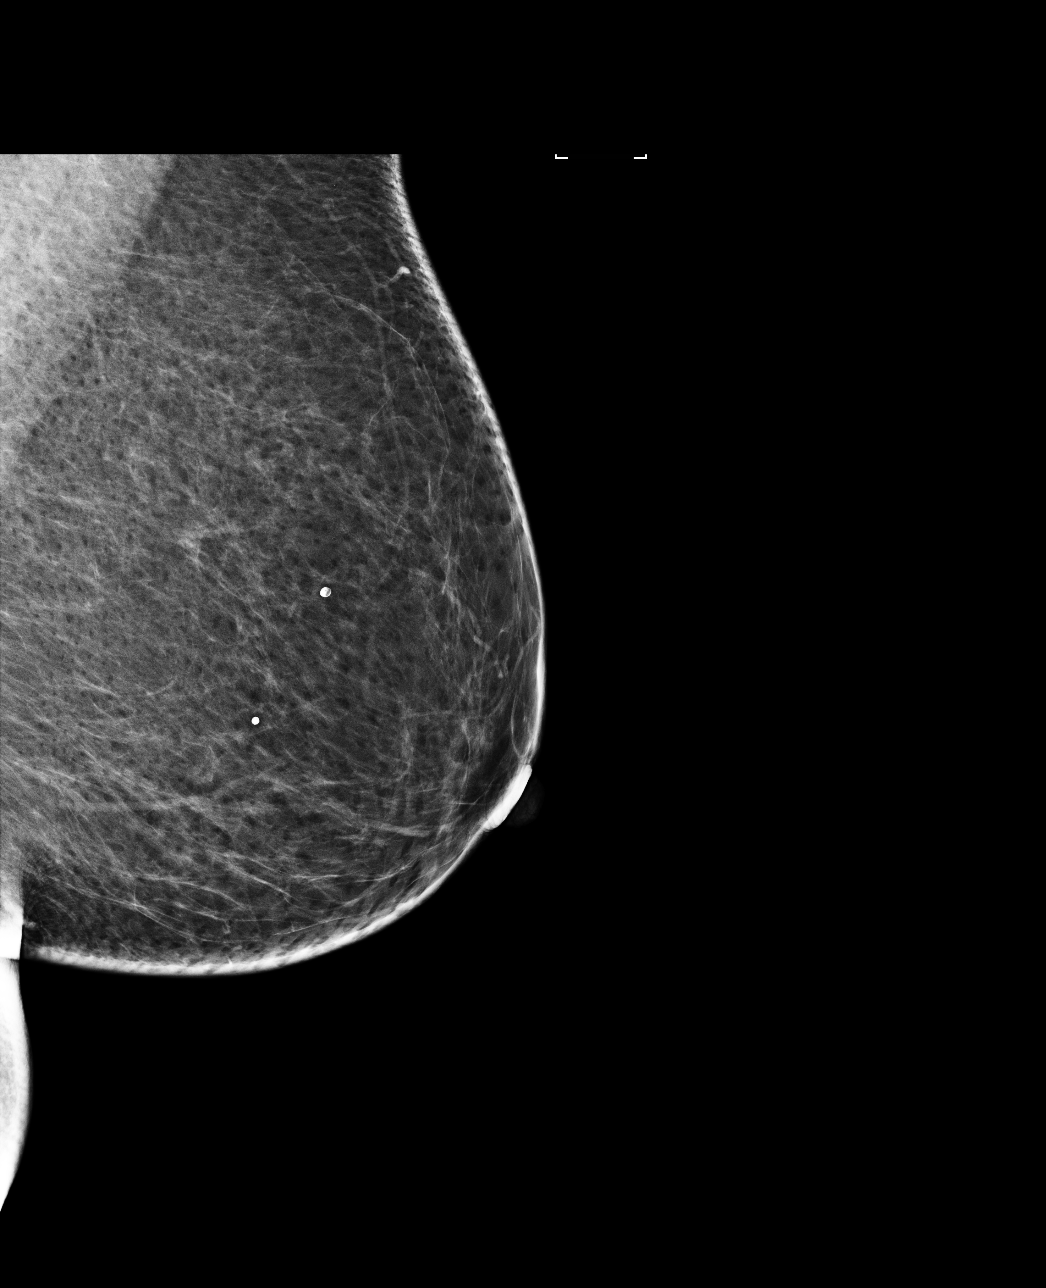

[R MLO]
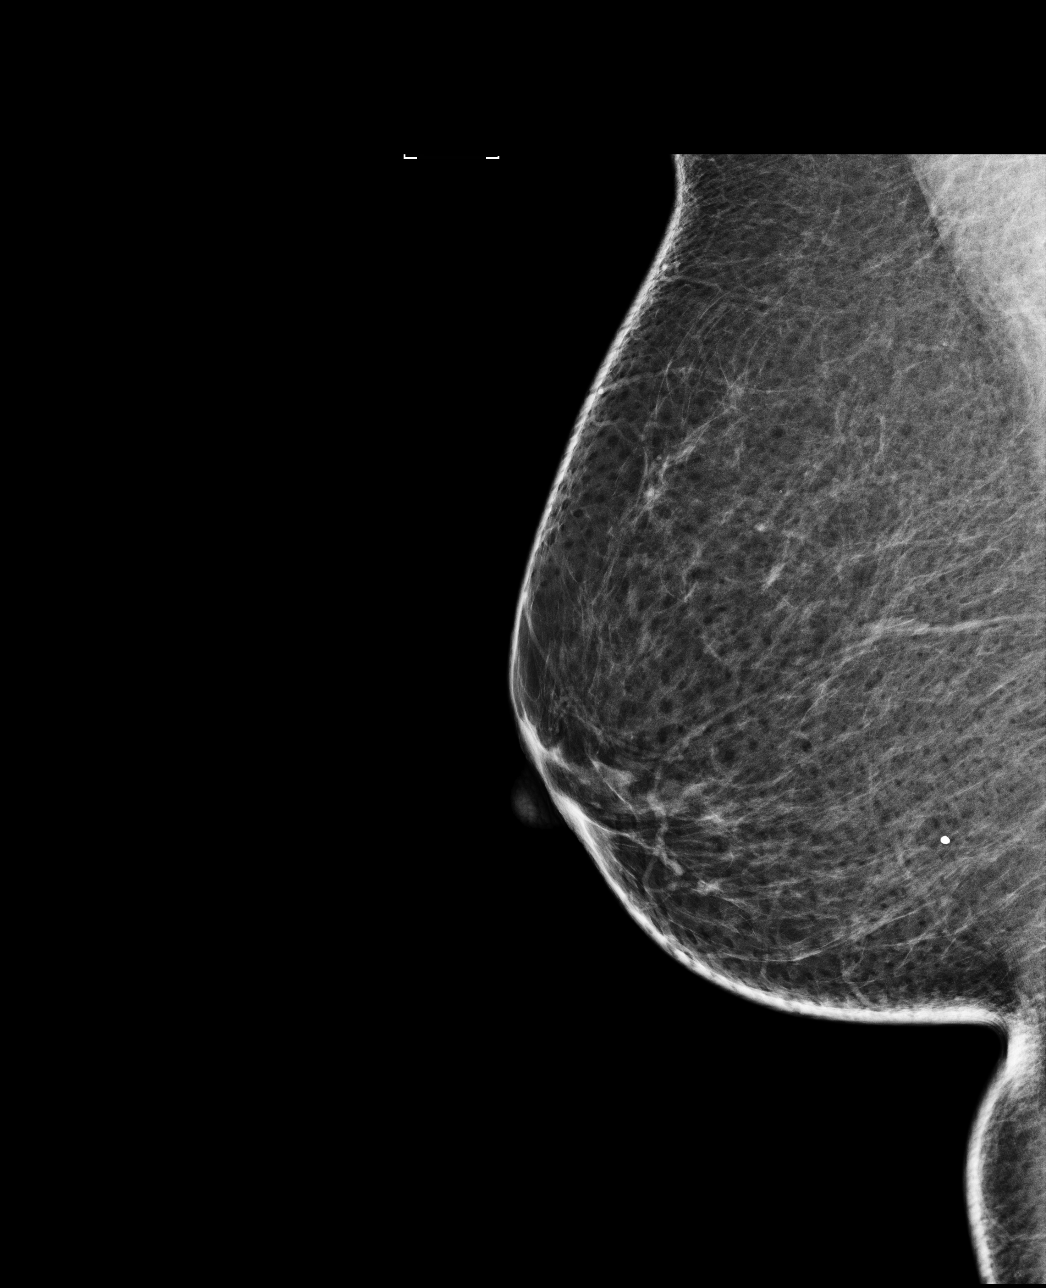

[L CC]
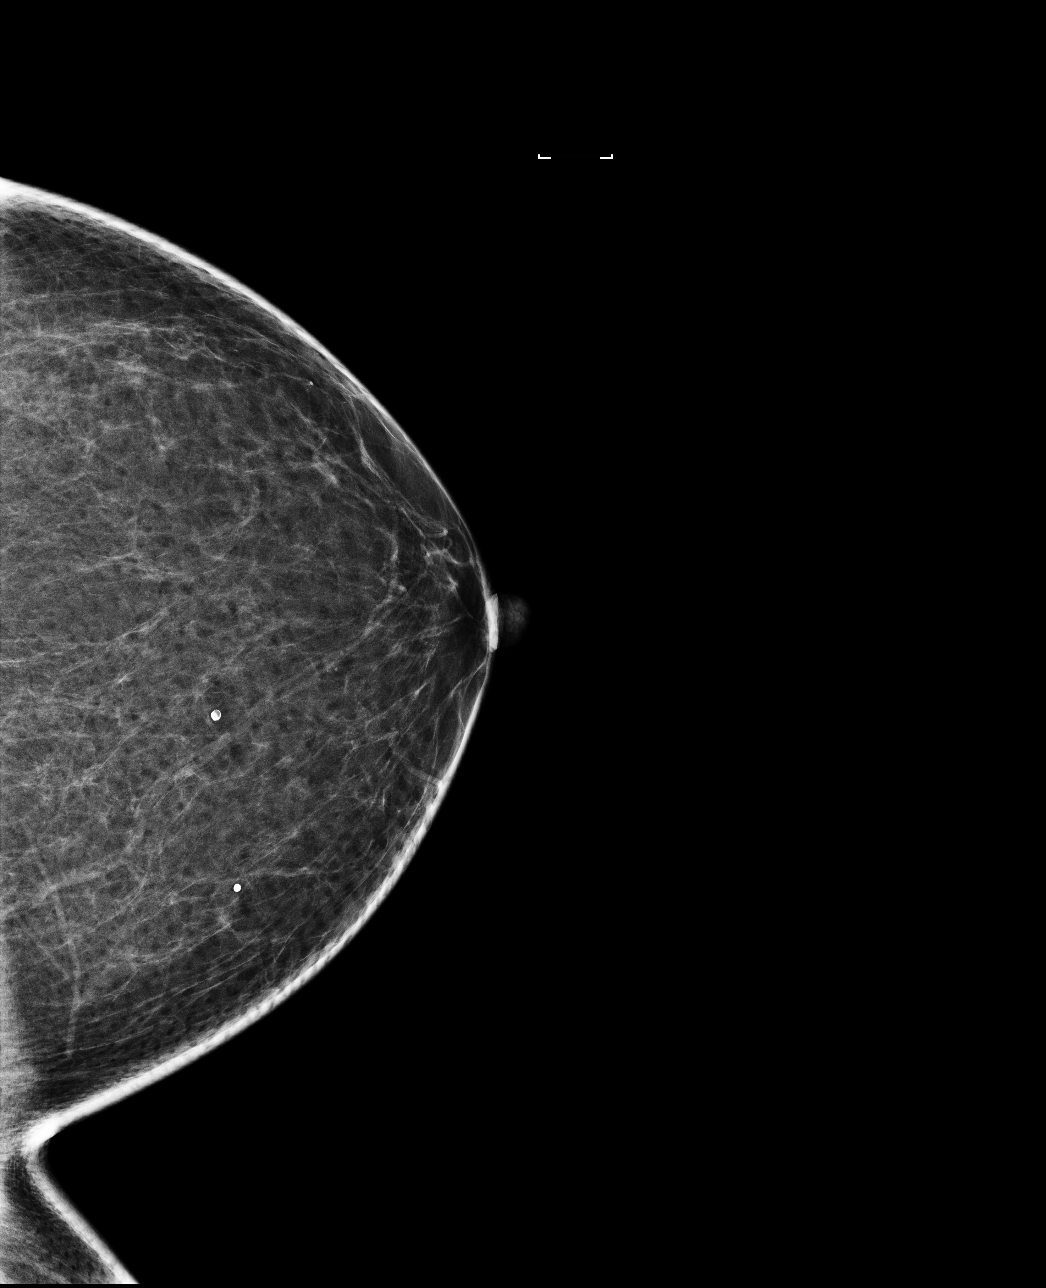

[R CC]
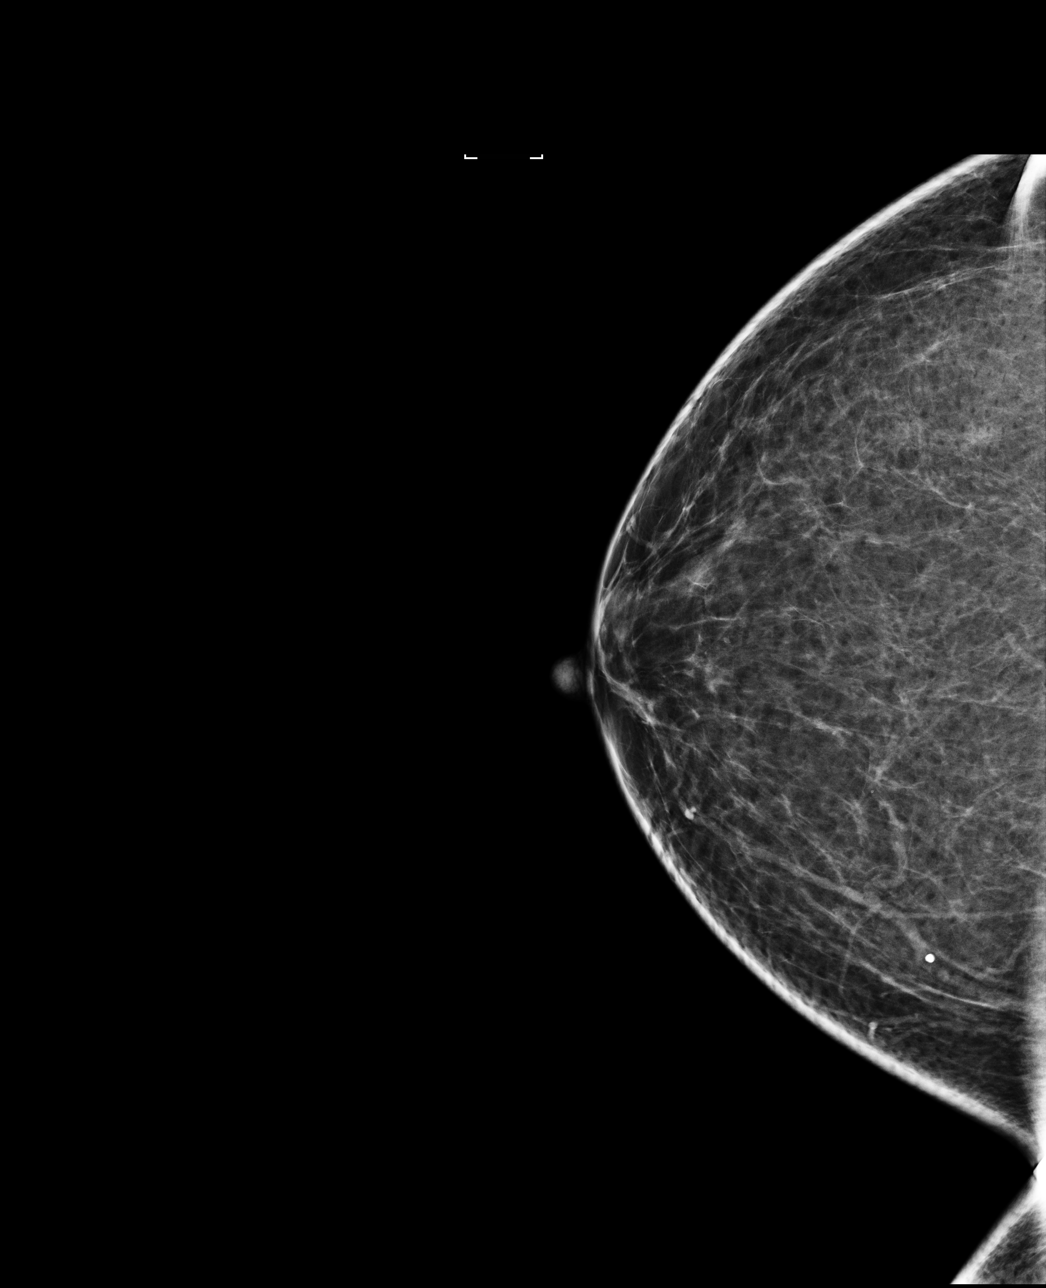

[4 of 4 positions shown; findings below may reference images not displayed]

ACR Breast Density Category b: There are scattered areas of
fibroglandular density.
FINDINGS: There are no findings suspicious for malignancy. Images were
processed with CAD.
IMPRESSION: No mammographic evidence of malignancy. A result letter of this
screening mammogram will be mailed directly to the patient.

RECOMMENDATION:
Screening mammogram in one year. (Code:AS-G-LCT)

BI-RADS CATEGORY  1: Negative.

## 2020-11-04 DIAGNOSIS — H52223 Regular astigmatism, bilateral: Secondary | ICD-10-CM | POA: Diagnosis not present

## 2021-11-05 DIAGNOSIS — H5203 Hypermetropia, bilateral: Secondary | ICD-10-CM | POA: Diagnosis not present

## 2022-07-04 DIAGNOSIS — Z79899 Other long term (current) drug therapy: Secondary | ICD-10-CM | POA: Diagnosis not present

## 2022-07-04 DIAGNOSIS — R011 Cardiac murmur, unspecified: Secondary | ICD-10-CM | POA: Diagnosis not present

## 2022-07-04 DIAGNOSIS — Z23 Encounter for immunization: Secondary | ICD-10-CM | POA: Diagnosis not present

## 2022-07-04 DIAGNOSIS — Z1159 Encounter for screening for other viral diseases: Secondary | ICD-10-CM | POA: Diagnosis not present

## 2022-07-04 DIAGNOSIS — I509 Heart failure, unspecified: Secondary | ICD-10-CM | POA: Diagnosis not present

## 2022-07-04 DIAGNOSIS — E785 Hyperlipidemia, unspecified: Secondary | ICD-10-CM | POA: Diagnosis not present

## 2022-07-04 DIAGNOSIS — Z0000 Encounter for general adult medical examination without abnormal findings: Secondary | ICD-10-CM | POA: Diagnosis not present

## 2022-07-04 DIAGNOSIS — I10 Essential (primary) hypertension: Secondary | ICD-10-CM | POA: Diagnosis not present

## 2022-07-04 DIAGNOSIS — Z87891 Personal history of nicotine dependence: Secondary | ICD-10-CM | POA: Diagnosis not present

## 2022-08-12 ENCOUNTER — Encounter: Payer: Self-pay | Admitting: Gastroenterology

## 2022-11-15 ENCOUNTER — Ambulatory Visit
Admission: RE | Admit: 2022-11-15 | Discharge: 2022-11-15 | Disposition: A | Discharge status: Home / Self Care | Payer: Medicare HMO | Source: Ambulatory Visit | Attending: Family Medicine | Admitting: Family Medicine

## 2022-11-15 ENCOUNTER — Other Ambulatory Visit: Payer: Self-pay | Admitting: Family Medicine

## 2022-11-15 DIAGNOSIS — M25562 Pain in left knee: Secondary | ICD-10-CM
